# Patient Record
Sex: Female | Born: 1990 | Race: White | Hispanic: No | Marital: Single | Smoking: Never smoker
Health system: Southern US, Community
[De-identification: ages and names within clinical notes are randomized; demographics above are authoritative.]

## PROBLEM LIST (undated history)

## (undated) HISTORY — PX: LAPAROSCOPIC ENDOMETRIOSIS FULGURATION: SUR769

---

## 2011-03-27 ENCOUNTER — Encounter (HOSPITAL_COMMUNITY): Payer: Self-pay | Admitting: *Deleted

## 2011-03-27 ENCOUNTER — Inpatient Hospital Stay (HOSPITAL_COMMUNITY)
Admission: AD | Admit: 2011-03-27 | Discharge: 2011-03-27 | Disposition: A | Discharge status: Home / Self Care | Payer: BC Managed Care – PPO | Source: Ambulatory Visit | Attending: Obstetrics & Gynecology | Admitting: Obstetrics & Gynecology

## 2011-03-27 DIAGNOSIS — O4700 False labor before 37 completed weeks of gestation, unspecified trimester: Secondary | ICD-10-CM | POA: Insufficient documentation

## 2011-03-27 DIAGNOSIS — O479 False labor, unspecified: Secondary | ICD-10-CM

## 2011-03-27 DIAGNOSIS — O2390 Unspecified genitourinary tract infection in pregnancy, unspecified trimester: Secondary | ICD-10-CM | POA: Insufficient documentation

## 2011-03-27 DIAGNOSIS — B3731 Acute candidiasis of vulva and vagina: Secondary | ICD-10-CM | POA: Insufficient documentation

## 2011-03-27 DIAGNOSIS — E860 Dehydration: Secondary | ICD-10-CM | POA: Insufficient documentation

## 2011-03-27 DIAGNOSIS — B373 Candidiasis of vulva and vagina: Secondary | ICD-10-CM

## 2011-03-27 LAB — URINALYSIS, ROUTINE W REFLEX MICROSCOPIC
Bilirubin Urine: NEGATIVE
Hgb urine dipstick: NEGATIVE
Specific Gravity, Urine: 1.025 (ref 1.005–1.030)
Urobilinogen, UA: 0.2 mg/dL (ref 0.0–1.0)

## 2011-03-27 LAB — WET PREP, GENITAL
Clue Cells Wet Prep HPF POC: NONE SEEN
Trich, Wet Prep: NONE SEEN

## 2011-03-27 LAB — URINE MICROSCOPIC-ADD ON

## 2011-03-27 MED ORDER — FLUCONAZOLE 150 MG PO TABS: 150.0000 mg | ORAL_TABLET | Freq: Once | ORAL | Status: AC

## 2011-03-27 NOTE — Progress Notes (Signed)
Pt. States at 1730 contractions started and they were about 4 minutes apart until 1900.  They started to taper off, but her doctor suggested she come in and get checked out.

## 2011-03-27 NOTE — ED Provider Notes (Signed)
History     Chief Complaint  Patient presents with  . Labor Eval   HPI This is a 20 year old G1 P0 at 27 weeks 1 day who presents to the MAU with contractions that started at approximately 5 PM. She states that her contractions were approximately 5 minutes apart and moderate in intensity. She called her obstetrician in [place], who advised her to present to the [facility] hospital for evaluation. The patient states that her contractions have spaced out somewhat, but still feels tightening in her belly. She does admit to having a yellow vaginal discharge that started approximately 3 days ago, but denies vaginal itching or foul odor to the discharge. She also admits to urinary frequency, but denies incomplete bladder emptying. She denies recent sexual activity, fevers, chills, nausea, vomiting, back pain.  OB History    Grav Para Term Preterm Abortions TAB SAB Ect Mult Living   1               No past medical history on file.  Past Surgical History  Procedure Date  . Laparoscopic endometriosis fulguration     No family history on file.  History  Substance Use Topics  . Smoking status: Never Smoker   . Smokeless tobacco: Not on file  . Alcohol Use: No    Allergies:  Allergies  Allergen Reactions  . Codeine     Patient stated both parents are allergic so she will not take it    Prescriptions prior to admission  Medication Sig Dispense Refill  . calcium carbonate (TUMS - DOSED IN MG ELEMENTAL CALCIUM) 500 MG chewable tablet Chew 1 tablet by mouth daily as needed. indigestion      . prenatal vitamin w/FE, FA (PRENATAL 1 + 1) 27-1 MG TABS Take 1 tablet by mouth daily.         Review of Systems  All other systems reviewed and are negative.   Physical Exam   Blood pressure 112/74, pulse 105, temperature 100.4 F (38 C), temperature source Oral, resp. rate 16, height 5' 1.5" (1.562 m), weight 64.014 kg (141 lb 2 oz).  Physical Exam  Constitutional: She appears well-developed  and well-nourished.  HENT:  Head: Normocephalic and atraumatic.  Eyes: Pupils are equal, round, and reactive to light.  Neck: Normal range of motion.  Cardiovascular: Normal rate.   Respiratory: Effort normal.  GI: Soft. Bowel sounds are normal. She exhibits no distension and no mass. There is no tenderness. There is no rebound and no guarding.  Genitourinary:       Yellowish vaginal discharge. Cervix is closed by visual inspection.   Cervical exam: Closed/thick/high  NST shows baseline rate of 150 with occasional contractions approximately 10 minutes apart.  Urinalysis: Specific gravity of 1.025. Small leukocyte Estrace, negative nitrites, ketones 15. Wet prep: few Yeast, no trichomonas or clue cells. Numerous white blood cells.  MAU Course  Procedures  MDM With no cervical change despite having contractions, this does not represent preterm labor. The description of the contractions now as abdominal tightening there more consistent with Braxton Hicks contractions that were brought on by a vaginal yeast infection as well as mild dehydration.  Assessment and Plan  #1 G1 P0 at 27 weeks 1 day #2 vaginal yeast infection #3 mild dehydration #4 Braxton Hicks contractions  I encouraged the patient to drink more water, particularly as this may be contributing to the patient's [employer] contractions. I will prescribe the patient Diflucan 150 mg 1 tablet. The patient's urine  was sent culture to rule out urinary tract infection. The patient was encouraged to return to the MAU if the contractions become more intense again. Patient will followup with her primary obstetrician in 10 days.  STINSON, JACOB JEHIEL 03/27/2011, 10:51 PM

## 2011-03-28 LAB — GC/CHLAMYDIA PROBE AMP, GENITAL
Chlamydia, DNA Probe: NEGATIVE
GC Probe Amp, Genital: NEGATIVE

## 2014-02-19 ENCOUNTER — Encounter (HOSPITAL_COMMUNITY): Payer: Self-pay | Admitting: *Deleted

## 2015-12-20 LAB — CYTOLOGY - PAP: PAP SMEAR: NEGATIVE

## 2016-05-07 ENCOUNTER — Encounter: Payer: Self-pay | Admitting: Student

## 2016-05-07 ENCOUNTER — Ambulatory Visit (INDEPENDENT_AMBULATORY_CARE_PROVIDER_SITE_OTHER): Payer: Medicaid Other | Admitting: Student

## 2016-05-07 ENCOUNTER — Other Ambulatory Visit: Payer: Self-pay | Admitting: Student

## 2016-05-07 ENCOUNTER — Ambulatory Visit
Admission: RE | Admit: 2016-05-07 | Discharge: 2016-05-07 | Disposition: A | Discharge status: Home / Self Care | Payer: Medicaid Other | Source: Ambulatory Visit | Attending: Student | Admitting: Student

## 2016-05-07 VITALS — BP 112/75 | HR 102 | Wt 130.0 lb

## 2016-05-07 DIAGNOSIS — Z3491 Encounter for supervision of normal pregnancy, unspecified, first trimester: Secondary | ICD-10-CM | POA: Diagnosis present

## 2016-05-07 DIAGNOSIS — Z3A01 Less than 8 weeks gestation of pregnancy: Secondary | ICD-10-CM | POA: Insufficient documentation

## 2016-05-07 DIAGNOSIS — Z3490 Encounter for supervision of normal pregnancy, unspecified, unspecified trimester: Secondary | ICD-10-CM

## 2016-05-07 DIAGNOSIS — O3481 Maternal care for other abnormalities of pelvic organs, first trimester: Secondary | ICD-10-CM | POA: Diagnosis not present

## 2016-05-07 NOTE — Progress Notes (Signed)
Last Pap in CareEverywhere from [facility] - 12/2014-Normal Bedside US shows Gestational Sac only measuring [id number] [facility]

## 2016-05-07 NOTE — Progress Notes (Addendum)
Patient arrived for her NOB appointment but bedside US was inconclusive for IUP. Patient has not had a cycle since she went off the IUD in October, and thus her gestational age is unknown. Given inability to detect SIUP [facility] and will reschedule her NOB visit. Patient agreed; appointment scheduled for 3:30 pm today (1/18). Patient denies bleeding, abdominal pain or cramping at this time.

## 2016-05-07 NOTE — Progress Notes (Unsigned)
Spoke with patient, reviewed today's US with patient. Patient to schedule NOB visit and [facility]

## 2016-05-21 ENCOUNTER — Encounter: Payer: Medicaid Other | Admitting: Student

## 2016-05-21 HISTORY — PX: DILATION AND CURETTAGE OF UTERUS: SHX78

## 2016-07-13 ENCOUNTER — Encounter: Payer: Self-pay | Admitting: Obstetrics & Gynecology

## 2016-07-13 ENCOUNTER — Ambulatory Visit (INDEPENDENT_AMBULATORY_CARE_PROVIDER_SITE_OTHER): Payer: Medicaid Other | Admitting: Obstetrics & Gynecology

## 2016-07-13 VITALS — BP 125/80 | HR 85 | Resp 18 | Ht 62.0 in | Wt 129.0 lb

## 2016-07-13 DIAGNOSIS — R102 Pelvic and perineal pain unspecified side: Secondary | ICD-10-CM

## 2016-07-13 DIAGNOSIS — N83201 Unspecified ovarian cyst, right side: Secondary | ICD-10-CM

## 2016-07-13 NOTE — Progress Notes (Signed)
Pt has a history of a right ovarian cyst, states she started experiencing pelvic pain yesterday that got severe this morning.

## 2016-07-13 NOTE — Progress Notes (Signed)
   GYNECOLOGY OFFICE VISIT NOTE  History:  26 y.o. Kelly Todd here today for acute onset of right sided pelvic pain that started yesterday and got worse this morning.  Currently, it has decreased and is 4/10 on a pain scale. It was a stabbing, severe, constant pain.  She denies any abnormal vaginal discharge, bleeding, fever, nausea, vomiting or other concerns.  Patient had a 3 cm right corpus luteum cyst on 05/07/2016 scan, feels this is a persistent cyst for over a year as she always has it on her right.   No past medical history on file.  Past Surgical History:  Procedure Laterality Date  . DILATION AND CURETTAGE OF UTERUS  05/2016  . LAPAROSCOPIC ENDOMETRIOSIS FULGURATION      The following portions of the patient's history were reviewed and updated as appropriate: allergies, current medications, past family history, past medical history, past social history, past surgical history and problem list.   Health Maintenance:  Normal pap on 12/20/2015.   Review of Systems:  Pertinent items noted in HPI and remainder of comprehensive ROS otherwise negative.   Objective:  Physical Exam BP 125/80 (BP Location: Left Arm, Patient Position: Sitting, Cuff Size: Normal)   Pulse 85   Resp 18   Ht 5\' 2"  (1.575 m)   Wt 129 lb (58.5 kg)   Breastfeeding? Unknown   BMI 23.59 kg/m  CONSTITUTIONAL: Well-developed, well-nourished female in no acute distress.  HENT:  Normocephalic, atraumatic. External right and left ear normal. Oropharynx is clear and moist EYES: Conjunctivae and EOM are normal. Pupils are equal, round, and reactive to light. No scleral icterus.  NECK: Normal range of motion, supple, no masses SKIN: Skin is warm and dry. No rash noted. Not diaphoretic. No erythema. No pallor. NEUROLOGIC: Alert and oriented to person, place, and time. Normal reflexes, muscle tone coordination. No cranial nerve deficit noted. PSYCHIATRIC: Normal mood and affect. Normal behavior. Normal judgment and thought  content. CARDIOVASCULAR: Normal heart rate noted RESPIRATORY: Effort and breath sounds normal, no problems with respiration noted ABDOMEN: Soft, no distention noted.  Mild RLQ tenderness. PELVIC: Deferred MUSCULOSKELETAL: Normal range of motion. No edema noted.  Labs and Imaging No results found.  Assessment & Plan:  1. Right ovarian cyst 2. Acute pelvic pain Last cyst was a physiologic cyst and had likely resolved.  Patient is on Nuvaring, likely not enough hormones for ovarian cyst suppression.  Recommended possibly switching to high dose OCP such as a 35 mcg estradiol pill to see if this will help, she will consider this.  Will take NSAIDs prn pain. Will get pelvic ultrasound and ensure no abnormal characteristics are noted.   - US Pelvis Complete; Future - US Transvaginal Non-OB; Future Routine preventative health maintenance measures emphasized. Please refer to After Visit Summary for other counseling recommendations.   Return if symptoms worsen or fail to improve.  Total face-to-face time with patient: 15 minutes. Over 50% of encounter was spent on counseling and coordination of care.   Jaynie CollinsUGONNA  Duard Spiewak, MD, FACOG Attending Obstetrician & Gynecologist, [facility], [facility]

## 2016-07-13 NOTE — Patient Instructions (Signed)
Return to clinic for any scheduled appointments or for any gynecologic concerns as needed.   

## 2016-07-16 ENCOUNTER — Ambulatory Visit: Payer: Medicaid Other

## 2016-07-20 ENCOUNTER — Ambulatory Visit: Admission: RE | Admit: 2016-07-20 | Payer: Medicaid Other | Source: Ambulatory Visit

## 2016-12-10 DIAGNOSIS — L638 Other alopecia areata: Secondary | ICD-10-CM | POA: Diagnosis not present

## 2019-03-15 DIAGNOSIS — J349 Unspecified disorder of nose and nasal sinuses: Secondary | ICD-10-CM | POA: Diagnosis not present

## 2019-03-15 DIAGNOSIS — Z7189 Other specified counseling: Secondary | ICD-10-CM | POA: Diagnosis not present

## 2019-03-20 DIAGNOSIS — Z20828 Contact with and (suspected) exposure to other viral communicable diseases: Secondary | ICD-10-CM | POA: Diagnosis not present

## 2019-04-24 DIAGNOSIS — Z130 Encounter for screening for diseases of the blood and blood-forming organs and certain disorders involving the immune mechanism: Secondary | ICD-10-CM | POA: Diagnosis not present

## 2019-04-24 DIAGNOSIS — M5441 Lumbago with sciatica, right side: Secondary | ICD-10-CM | POA: Diagnosis not present

## 2019-04-24 DIAGNOSIS — Z0000 Encounter for general adult medical examination without abnormal findings: Secondary | ICD-10-CM | POA: Diagnosis not present

## 2019-04-24 DIAGNOSIS — G8929 Other chronic pain: Secondary | ICD-10-CM | POA: Diagnosis not present

## 2019-04-24 DIAGNOSIS — Z13220 Encounter for screening for lipoid disorders: Secondary | ICD-10-CM | POA: Diagnosis not present

## 2019-04-24 DIAGNOSIS — Z1159 Encounter for screening for other viral diseases: Secondary | ICD-10-CM | POA: Diagnosis not present

## 2019-04-24 DIAGNOSIS — Z6827 Body mass index (BMI) 27.0-27.9, adult: Secondary | ICD-10-CM | POA: Diagnosis not present

## 2019-04-26 ENCOUNTER — Encounter: Payer: Self-pay | Admitting: Advanced Practice Midwife

## 2019-04-26 ENCOUNTER — Ambulatory Visit (INDEPENDENT_AMBULATORY_CARE_PROVIDER_SITE_OTHER): Payer: Medicaid Other | Admitting: Advanced Practice Midwife

## 2019-04-26 ENCOUNTER — Other Ambulatory Visit (HOSPITAL_COMMUNITY)
Admission: RE | Admit: 2019-04-26 | Discharge: 2019-04-26 | Disposition: A | Discharge status: Home / Self Care | Payer: Medicaid Other | Source: Ambulatory Visit | Attending: Obstetrics and Gynecology | Admitting: Obstetrics and Gynecology

## 2019-04-26 ENCOUNTER — Other Ambulatory Visit: Payer: Self-pay

## 2019-04-26 VITALS — BP 112/76 | HR 72 | Wt 148.2 lb

## 2019-04-26 DIAGNOSIS — Z01419 Encounter for gynecological examination (general) (routine) without abnormal findings: Secondary | ICD-10-CM | POA: Diagnosis not present

## 2019-04-26 DIAGNOSIS — R102 Pelvic and perineal pain: Secondary | ICD-10-CM

## 2019-04-26 DIAGNOSIS — Z0000 Encounter for general adult medical examination without abnormal findings: Secondary | ICD-10-CM

## 2019-04-26 NOTE — Progress Notes (Signed)
PAP TODAY  STI TESTING  Discuss Cyst Last pap 2017

## 2019-04-26 NOTE — Patient Instructions (Signed)
Preventive Care 21-29 Years Old, Female Preventive care refers to visits with your health care provider and lifestyle choices that can promote health and wellness. This includes:  A yearly physical exam. This may also be called an annual well check.  Regular dental visits and eye exams.  Immunizations.  Screening for certain conditions.  Healthy lifestyle choices, such as eating a healthy diet, getting regular exercise, not using drugs or products that contain nicotine and tobacco, and limiting alcohol use. What can I expect for my preventive care visit? Physical exam Your health care provider will check your:  Height and weight. This may be used to calculate body mass index (BMI), which tells if you are at a healthy weight.  Heart rate and blood pressure.  Skin for abnormal spots. Counseling Your health care provider may ask you questions about your:  Alcohol, tobacco, and drug use.  Emotional well-being.  Home and relationship well-being.  Sexual activity.  Eating habits.  Work and work environment.  Method of birth control.  Menstrual cycle.  Pregnancy history. What immunizations do I need?  Influenza (flu) vaccine  This is recommended every year. Tetanus, diphtheria, and pertussis (Tdap) vaccine  You may need a Td booster every 10 years. Varicella (chickenpox) vaccine  You may need this if you have not been vaccinated. Human papillomavirus (HPV) vaccine  If recommended by your health care provider, you may need three doses over 6 months. Measles, mumps, and rubella (MMR) vaccine  You may need at least one dose of MMR. You may also need a second dose. Meningococcal conjugate (MenACWY) vaccine  One dose is recommended if you are age 19-21 years and a first-year college student living in a residence hall, or if you have one of several medical conditions. You may also need additional booster doses. Pneumococcal conjugate (PCV13) vaccine  You may need  this if you have certain conditions and were not previously vaccinated. Pneumococcal polysaccharide (PPSV23) vaccine  You may need one or two doses if you smoke cigarettes or if you have certain conditions. Hepatitis A vaccine  You may need this if you have certain conditions or if you travel or work in places where you may be exposed to hepatitis A. Hepatitis B vaccine  You may need this if you have certain conditions or if you travel or work in places where you may be exposed to hepatitis B. Haemophilus influenzae type b (Hib) vaccine  You may need this if you have certain conditions. You may receive vaccines as individual doses or as more than one vaccine together in one shot (combination vaccines). Talk with your health care provider about the risks and benefits of combination vaccines. What tests do I need?  Blood tests  Lipid and cholesterol levels. These may be checked every 5 years starting at age 20.  Hepatitis C test.  Hepatitis B test. Screening  Diabetes screening. This is done by checking your blood sugar (glucose) after you have not eaten for a while (fasting).  Sexually transmitted disease (STD) testing.  BRCA-related cancer screening. This may be done if you have a family history of breast, ovarian, tubal, or peritoneal cancers.  Pelvic exam and Pap test. This may be done every 3 years starting at age 21. Starting at age 30, this may be done every 5 years if you have a Pap test in combination with an HPV test. Talk with your health care provider about your test results, treatment options, and if necessary, the need for more tests.   Follow these instructions at home: Eating and drinking   Eat a diet that includes fresh fruits and vegetables, whole grains, lean protein, and low-fat dairy.  Take vitamin and mineral supplements as recommended by your health care provider.  Do not drink alcohol if: ? Your health care provider tells you not to drink. ? You are  pregnant, may be pregnant, or are planning to become pregnant.  If you drink alcohol: ? Limit how much you have to 0-1 drink a day. ? Be aware of how much alcohol is in your drink. In the [place] one drink equals one 12 oz bottle of beer (355 mL), one 5 oz glass of wine (148 mL), or one 1 oz glass of hard liquor (44 mL). Lifestyle  Take daily care of your teeth and gums.  Stay active. Exercise for at least 30 minutes on 5 or more days each week.  Do not use any products that contain nicotine or tobacco, such as cigarettes, e-cigarettes, and chewing tobacco. If you need help quitting, ask your health care provider.  If you are sexually active, practice safe sex. Use a condom or other form of birth control (contraception) in order to prevent pregnancy and STIs (sexually transmitted infections). If you plan to become pregnant, see your health care provider for a preconception visit. What's next?  Visit your health care provider once a year for a well check visit.  Ask your health care provider how often you should have your eyes and teeth checked.  Stay up to date on all vaccines. This information is not intended to replace advice given to you by your health care provider. Make sure you discuss any questions you have with your health care provider. Document Revised: 12/16/2017 Document Reviewed: 12/16/2017 Elsevier Patient Education  2020 [employer].

## 2019-04-26 NOTE — Progress Notes (Signed)
History:  Ms. Kelly Todd is a 29 y.o. [phone] with history of ovarian cyst and endometriosis who presents to clinic today for annual well woman exam with pap. She reports R inguinal "pulling" pain that radiates down her leg and is wondering if it could be her ovarian cyst. The pain lasts for approx. 7-8 hours and occurs 1-2 times per week. The right sided ovarian cyst was found incidentally when she was pregnant with her last child; she reports some previous problems with cystic pain. She was previously on a NuvaRing and experienced hair loss. She does report tolerating Mirena in the past but is a little wary about using any birth control methods due to her experience with hair loss. She currently is not on any form of birth control; reports her husband had a vasectomy. She saw her PCP a few days ago for evaluation of this chronic pain; the lumbar x-ray that was ordered resulted negative.   The following portions of the patient's history were reviewed and updated as appropriate: allergies, current medications, family history, past medical history, social history, past surgical history and problem list.  Review of Systems:  Review of Systems  Respiratory: Negative for shortness of breath.   Cardiovascular: Negative for chest pain and palpitations.  Gastrointestinal: Negative for constipation and diarrhea.  Genitourinary: Negative for dysuria.       Negative for bleeding, discharge, or pruritis.   Neurological: Negative for headaches.      Objective:  Physical Exam BP 112/76   Pulse 72   Wt 67.2 kg   LMP  (Approximate)   BMI 27.11 kg/m  Physical Exam  Vitals reviewed. Constitutional: She is oriented to person, place, and time. She appears well-developed and well-nourished.  HENT:  Head: Normocephalic and atraumatic.  Eyes: Conjunctivae and EOM are normal.  Cardiovascular: Normal rate and regular rhythm.  No murmur heard. Respiratory: Effort normal and breath sounds normal. No breast  tenderness.  GI: Hernia confirmed negative in the right inguinal area.  Genitourinary:    Vagina normal.  Cervix exhibits discharge (minimal, white) and friability. Cervix exhibits no motion tenderness.    No vaginal discharge, erythema or tenderness.  No erythema or tenderness in the vagina.  Lymphadenopathy:       Right: No inguinal adenopathy present.  Neurological: She is alert and oriented to person, place, and time.  Skin: Skin is warm and dry.  Psychiatric: She has a normal mood and affect. Her behavior is normal.    Labs and Imaging No results found for this or any previous visit (from the past 24 hour(s)).  No results found.   Assessment & Plan:  1. Well woman exam with routine gynecological exam - Patient reports no concerns.  - PAP under 30  2. Pelvic pain Patient is not currently on NuvaRing, OCPs, etc for suppression of ovarian cysts. She had a traumatic experience with hair loss associated with NuvaRing and states she would rather get a hysterectomy before losing her hair again. Continue to take NSAIDs to help alleviate pain, will get another ultrasound for further evaluation.  - [place] G[facility] Pelvis Complete with Transvaginal; Future   Ellin Goodie, [place] 04/26/2019 11:43 AM   I confirm that I have verified the information documented in the physician assistant student's note and that I have also personally reperformed the history, physical exam and all medical decision making activities of this service and have verified that all service and findings are accurately documented in this student's note.  Clayton Bibles, [place] 04/26/2019 3:43 PM

## 2019-04-27 ENCOUNTER — Ambulatory Visit: Payer: Self-pay | Admitting: Obstetrics and Gynecology

## 2019-04-28 LAB — CYTOLOGY - PAP
Chlamydia: NEGATIVE
Comment: NEGATIVE
Comment: NEGATIVE
Comment: NORMAL
Diagnosis: NEGATIVE
Neisseria Gonorrhea: NEGATIVE
Trichomonas: NEGATIVE

## 2019-05-02 ENCOUNTER — Ambulatory Visit
Admission: RE | Admit: 2019-05-02 | Discharge: 2019-05-02 | Disposition: A | Discharge status: Home / Self Care | Payer: Medicaid Other | Source: Ambulatory Visit | Attending: Advanced Practice Midwife | Admitting: Advanced Practice Midwife

## 2019-05-02 ENCOUNTER — Other Ambulatory Visit: Payer: Self-pay

## 2019-05-02 DIAGNOSIS — R102 Pelvic and perineal pain: Secondary | ICD-10-CM | POA: Diagnosis not present

## 2019-05-02 DIAGNOSIS — N83202 Unspecified ovarian cyst, left side: Secondary | ICD-10-CM | POA: Diagnosis not present

## 2020-02-28 ENCOUNTER — Encounter: Payer: Self-pay | Admitting: Radiology

## 2020-11-06 IMAGING — US US PELVIS COMPLETE WITH TRANSVAGINAL
1 series · 14 of 25 positions shown · non-contrast
Comparison: None

CLINICAL DATA: Pelvic pain for 6 months; LMP 04/09/2019

EXAM:
TRANSABDOMINAL AND TRANSVAGINAL ULTRASOUND OF PELVIS
TECHNIQUE: Both transabdominal and transvaginal ultrasound examinations of the
pelvis were performed. Transabdominal technique was performed for
global imaging of the pelvis including uterus, ovaries, adnexal
regions, and pelvic cul-de-sac. It was necessary to proceed with
endovaginal exam following the transabdominal exam to visualize the
endometrium and ovaries.

[Series 1: us pelvis complete with transvaginal · 0.20mm/px · 14 of 114 slices shown]
[im 1/114]
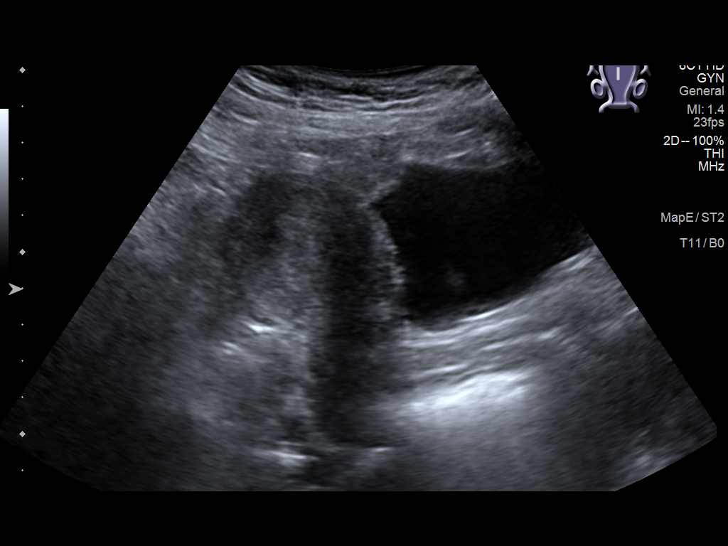
[im 10/114]
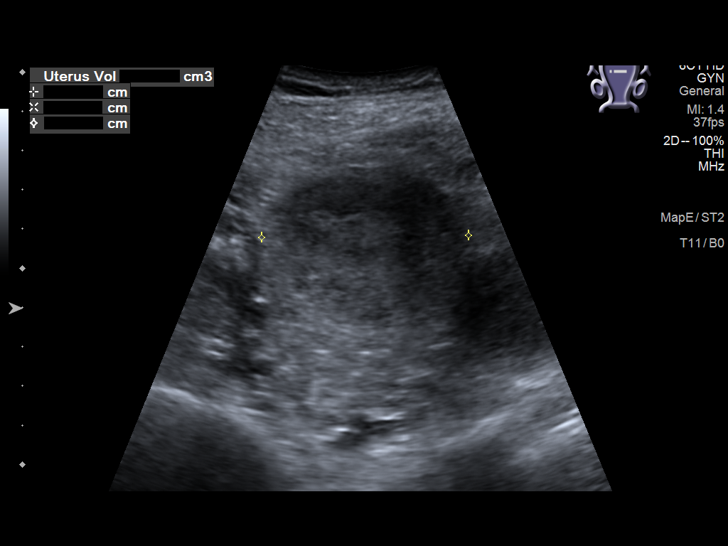
[im 19/114]
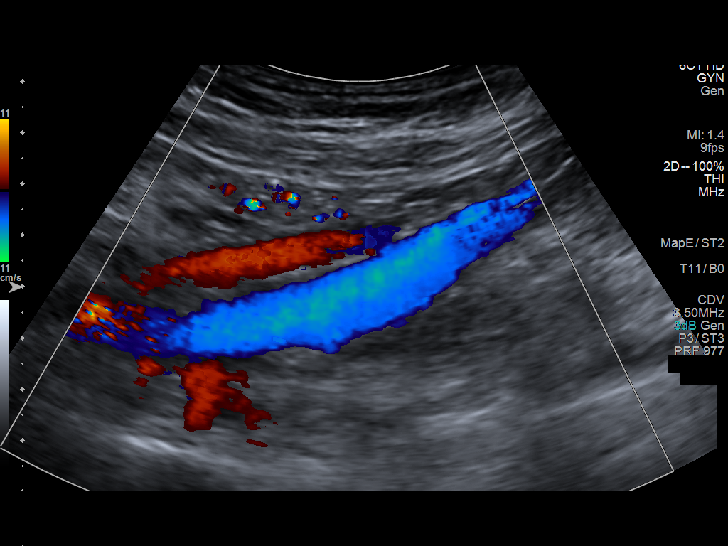
[im 29/114]
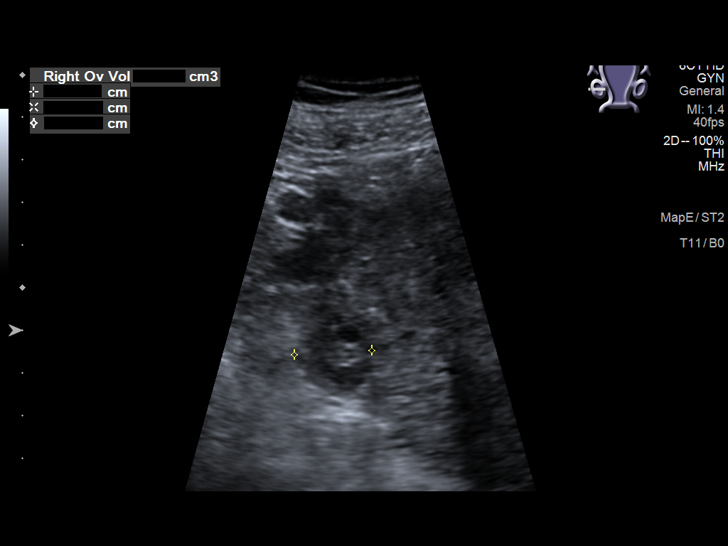
[im 38/114]
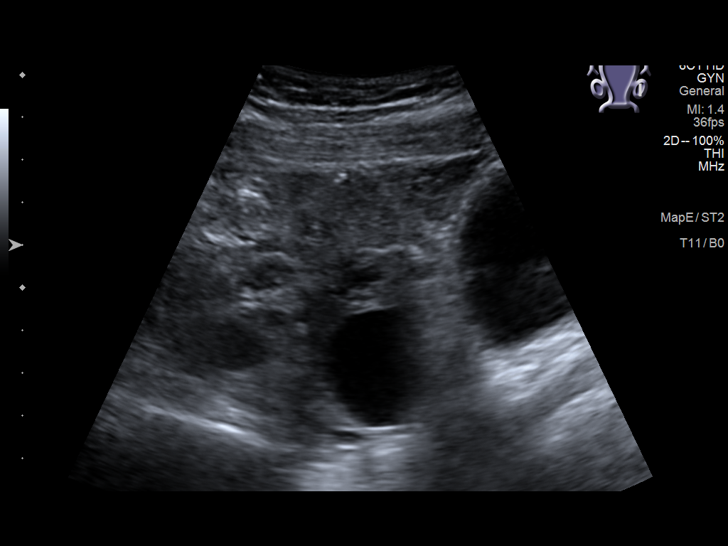
[im 43/114]
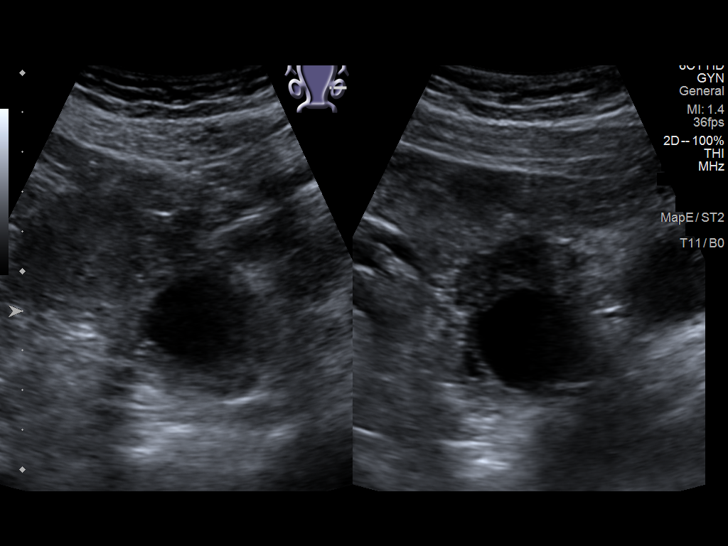
[im 52/114]
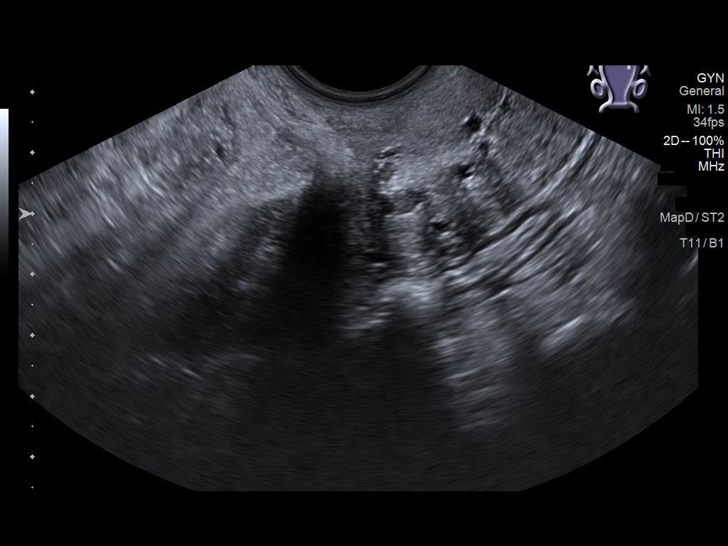
[im 62/114]
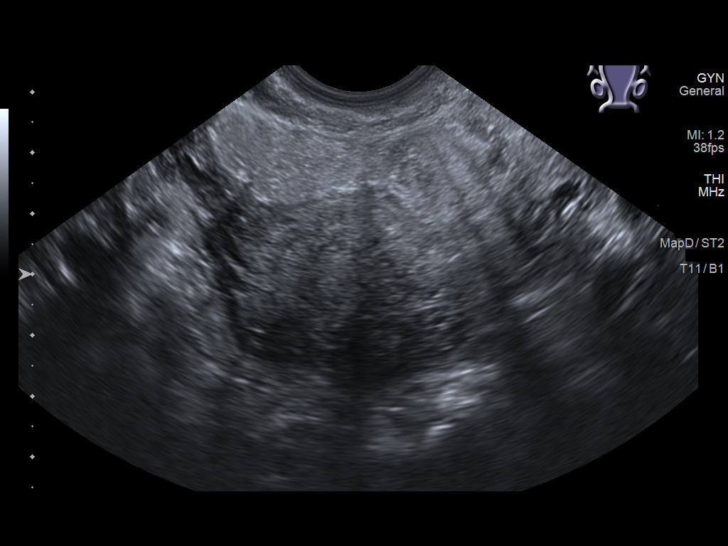
[im 71/114]
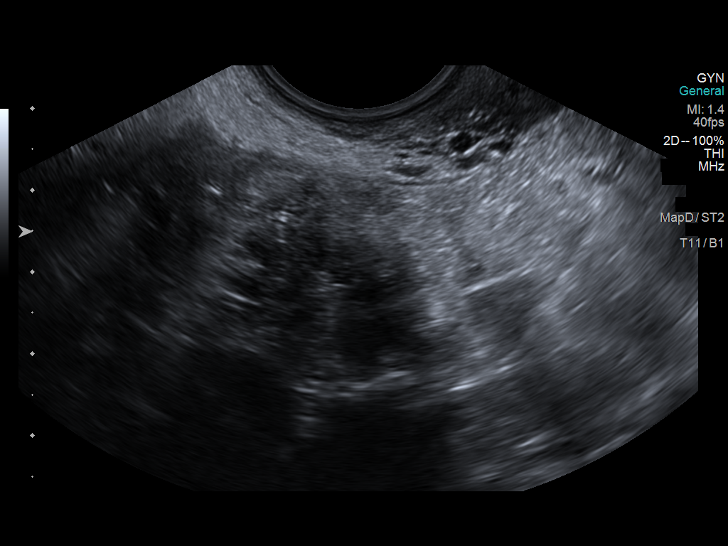
[im 76/114]
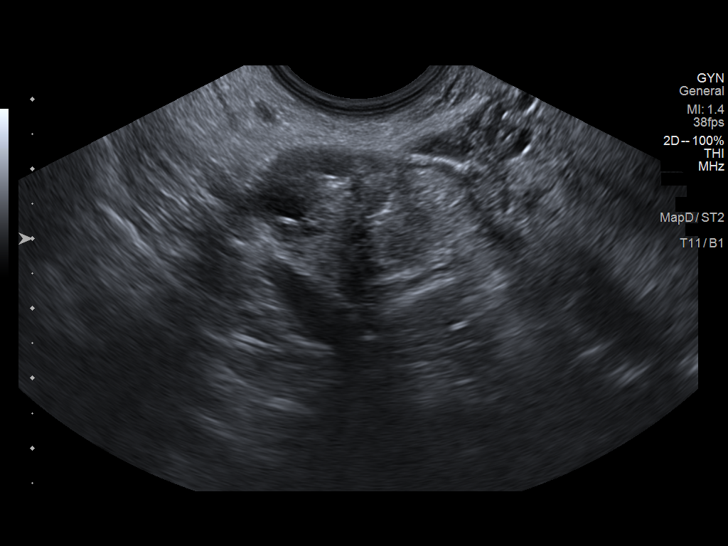
[im 85/114]
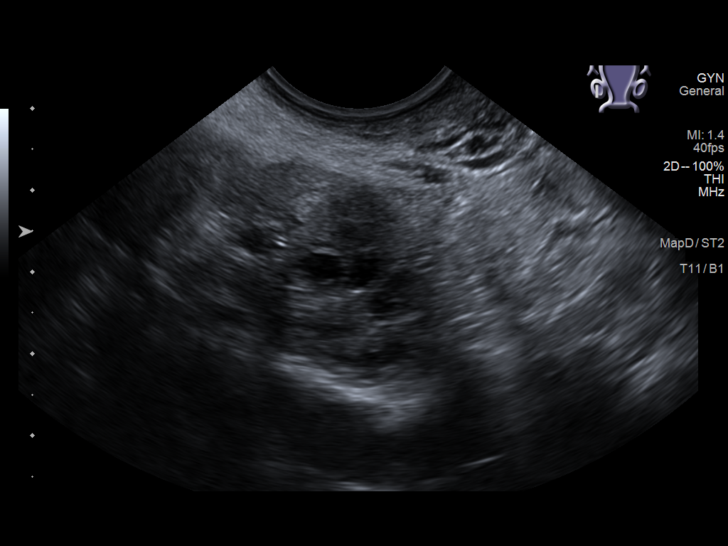
[im 95/114]
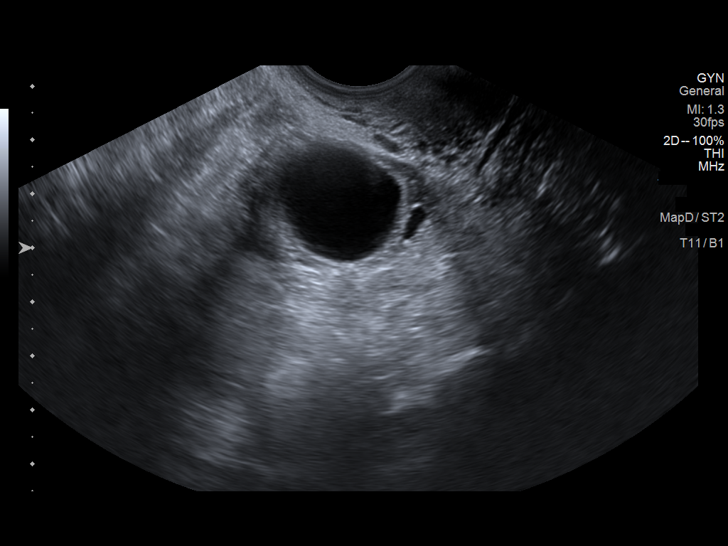
[im 104/114]
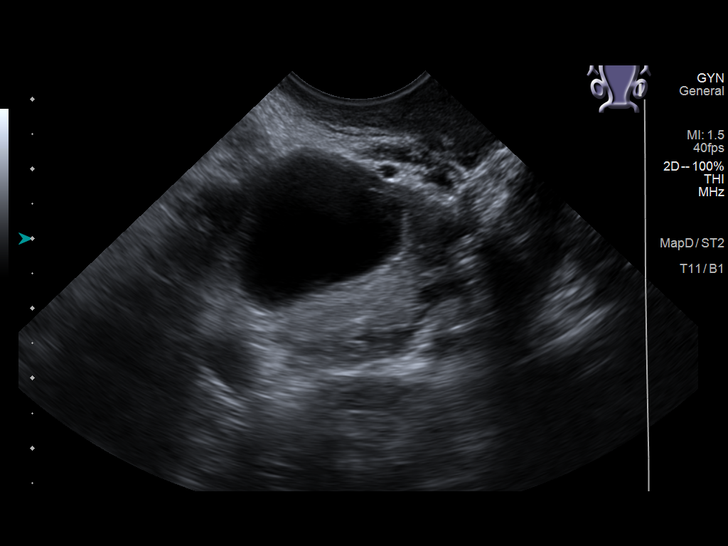
[im 114/114]
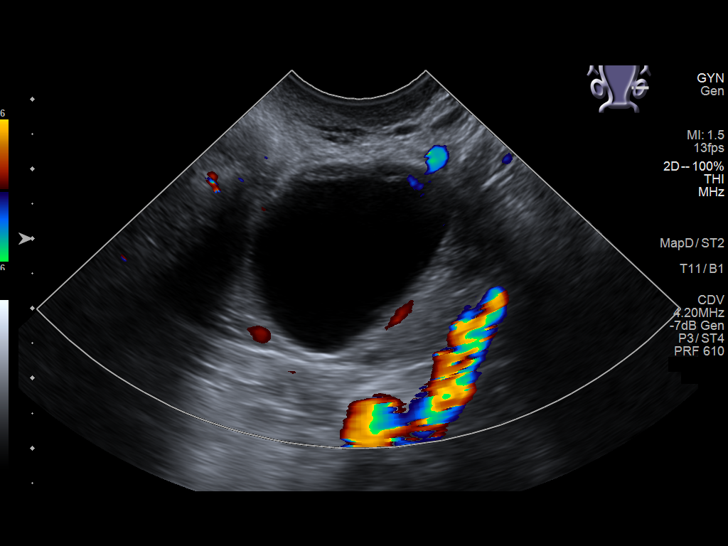

[14 of 25 positions shown; findings below may reference images not displayed]

FINDINGS: Uterus

Measurements: 8.8 x 3.9 x 4.8 cm = volume: 87 mL. Anteverted.
Nabothian cysts at cervix. Otherwise normal morphology without mass

Endometrium

Thickness: 7 mm.  No endometrial fluid or focal abnormality

Right ovary

Measurements: 3.1 x 2.4 x 2.2 cm = volume: 8.5 mL. Multiple
follicles without mass

Left ovary

Measurements: 3.6 x 3.3 x 3.7 cm = volume: 23 mL. Small simple cyst
3.1 x 2.7 x 2.8 cm. No additional masses

Other findings

No free pelvic fluid or at additional adnexal masses.
IMPRESSION: Small simple cyst LEFT ovary 3.1 cm greatest size, likely
physiologic.

Otherwise normal exam.
